# Patient Record
Sex: Female | Born: 1990 | Race: White | Marital: Single
Health system: Southern US, Community
[De-identification: ages and names within clinical notes are randomized; demographics above are authoritative.]

## PROBLEM LIST (undated history)

## (undated) DIAGNOSIS — E0500 Thyrotoxicosis with diffuse goiter without thyrotoxic crisis or storm: Secondary | ICD-10-CM

## (undated) HISTORY — PX: APPENDECTOMY: SHX54

## (undated) HISTORY — PX: EYE SURGERY: SHX253

---

## 2015-01-24 ENCOUNTER — Ambulatory Visit
Admission: RE | Admit: 2015-01-24 | Discharge: 2015-01-24 | Disposition: A | Discharge status: Home / Self Care | Payer: No Typology Code available for payment source | Source: Ambulatory Visit | Attending: Occupational Medicine | Admitting: Occupational Medicine

## 2015-01-24 ENCOUNTER — Other Ambulatory Visit: Payer: Self-pay | Admitting: Occupational Medicine

## 2015-01-24 DIAGNOSIS — Z021 Encounter for pre-employment examination: Secondary | ICD-10-CM

## 2015-01-30 ENCOUNTER — Other Ambulatory Visit: Payer: Self-pay | Admitting: Occupational Medicine

## 2015-11-28 ENCOUNTER — Other Ambulatory Visit: Payer: Self-pay | Admitting: Nurse Practitioner

## 2015-11-28 ENCOUNTER — Ambulatory Visit
Admission: RE | Admit: 2015-11-28 | Discharge: 2015-11-28 | Disposition: A | Discharge status: Home / Self Care | Payer: Worker's Compensation | Source: Ambulatory Visit | Attending: Nurse Practitioner | Admitting: Nurse Practitioner

## 2015-11-28 DIAGNOSIS — M25512 Pain in left shoulder: Secondary | ICD-10-CM

## 2018-01-28 IMAGING — CR DG SHOULDER 2+V*L*
3 series · 3 of 3 positions shown · non-contrast
Comparison: None.

CLINICAL DATA: Shoulder injury.  01/24/2015.

EXAM:
LEFT SHOULDER - 2+ VIEW

[w shoulder grashey left]
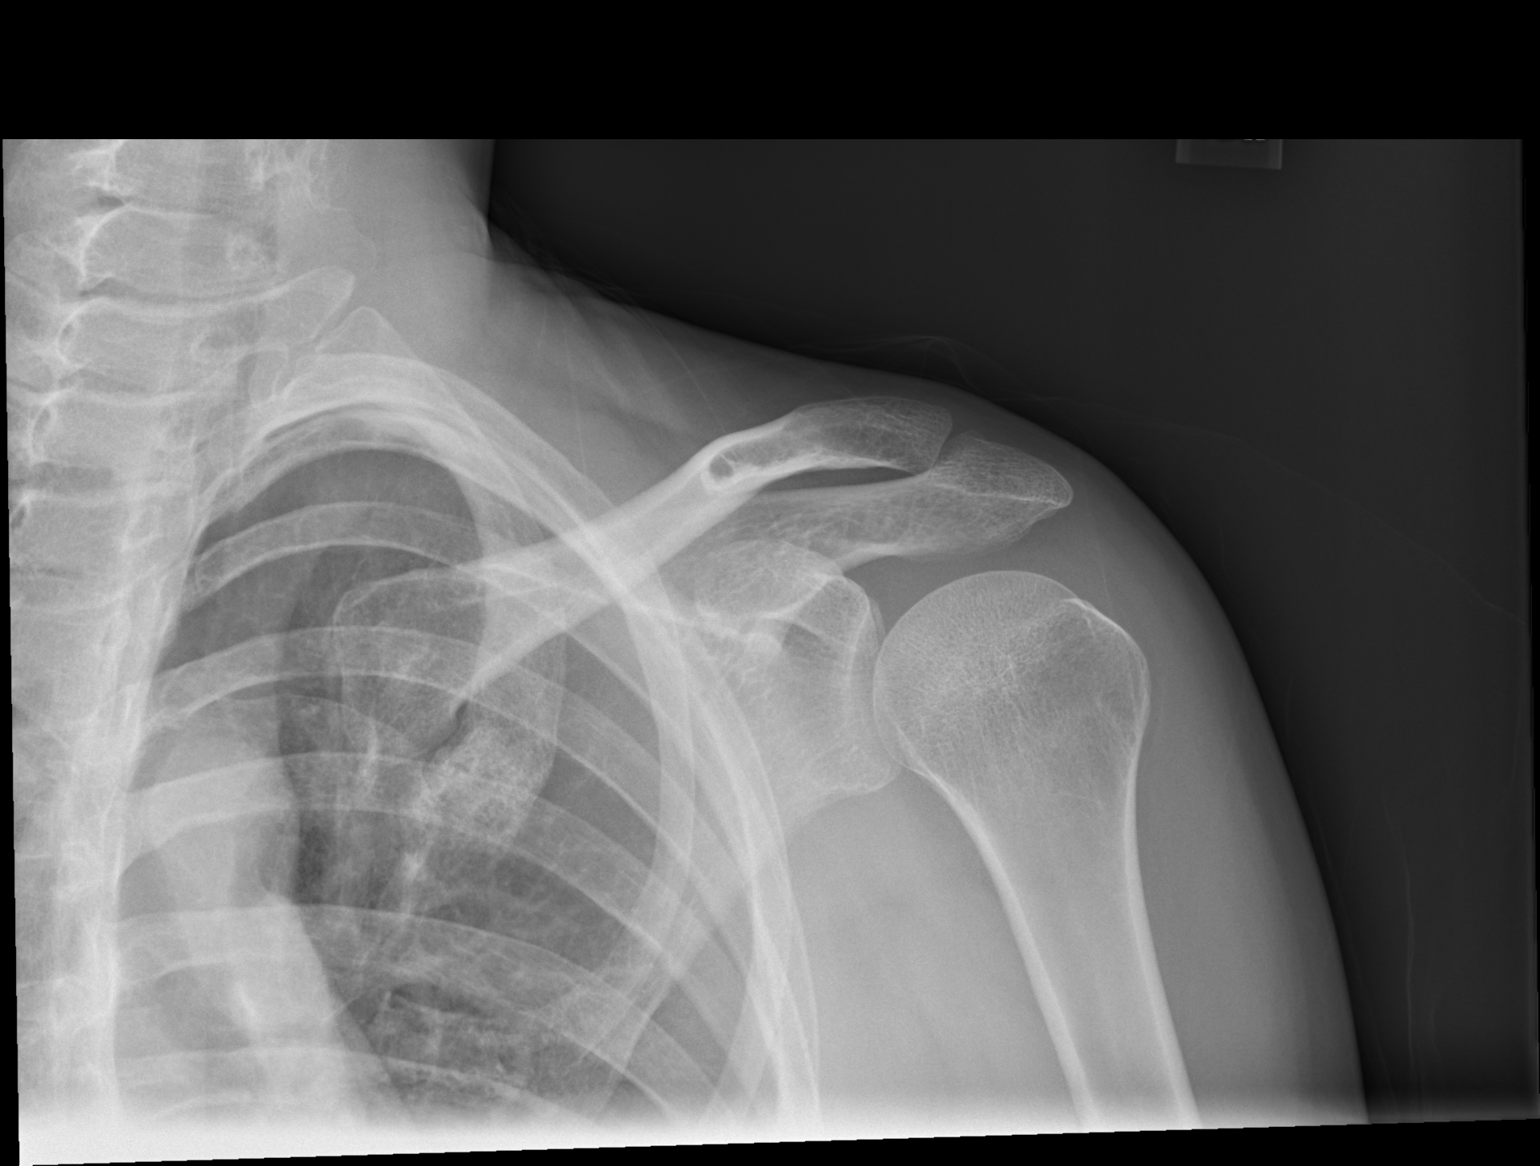

[w shoulder y-view left]
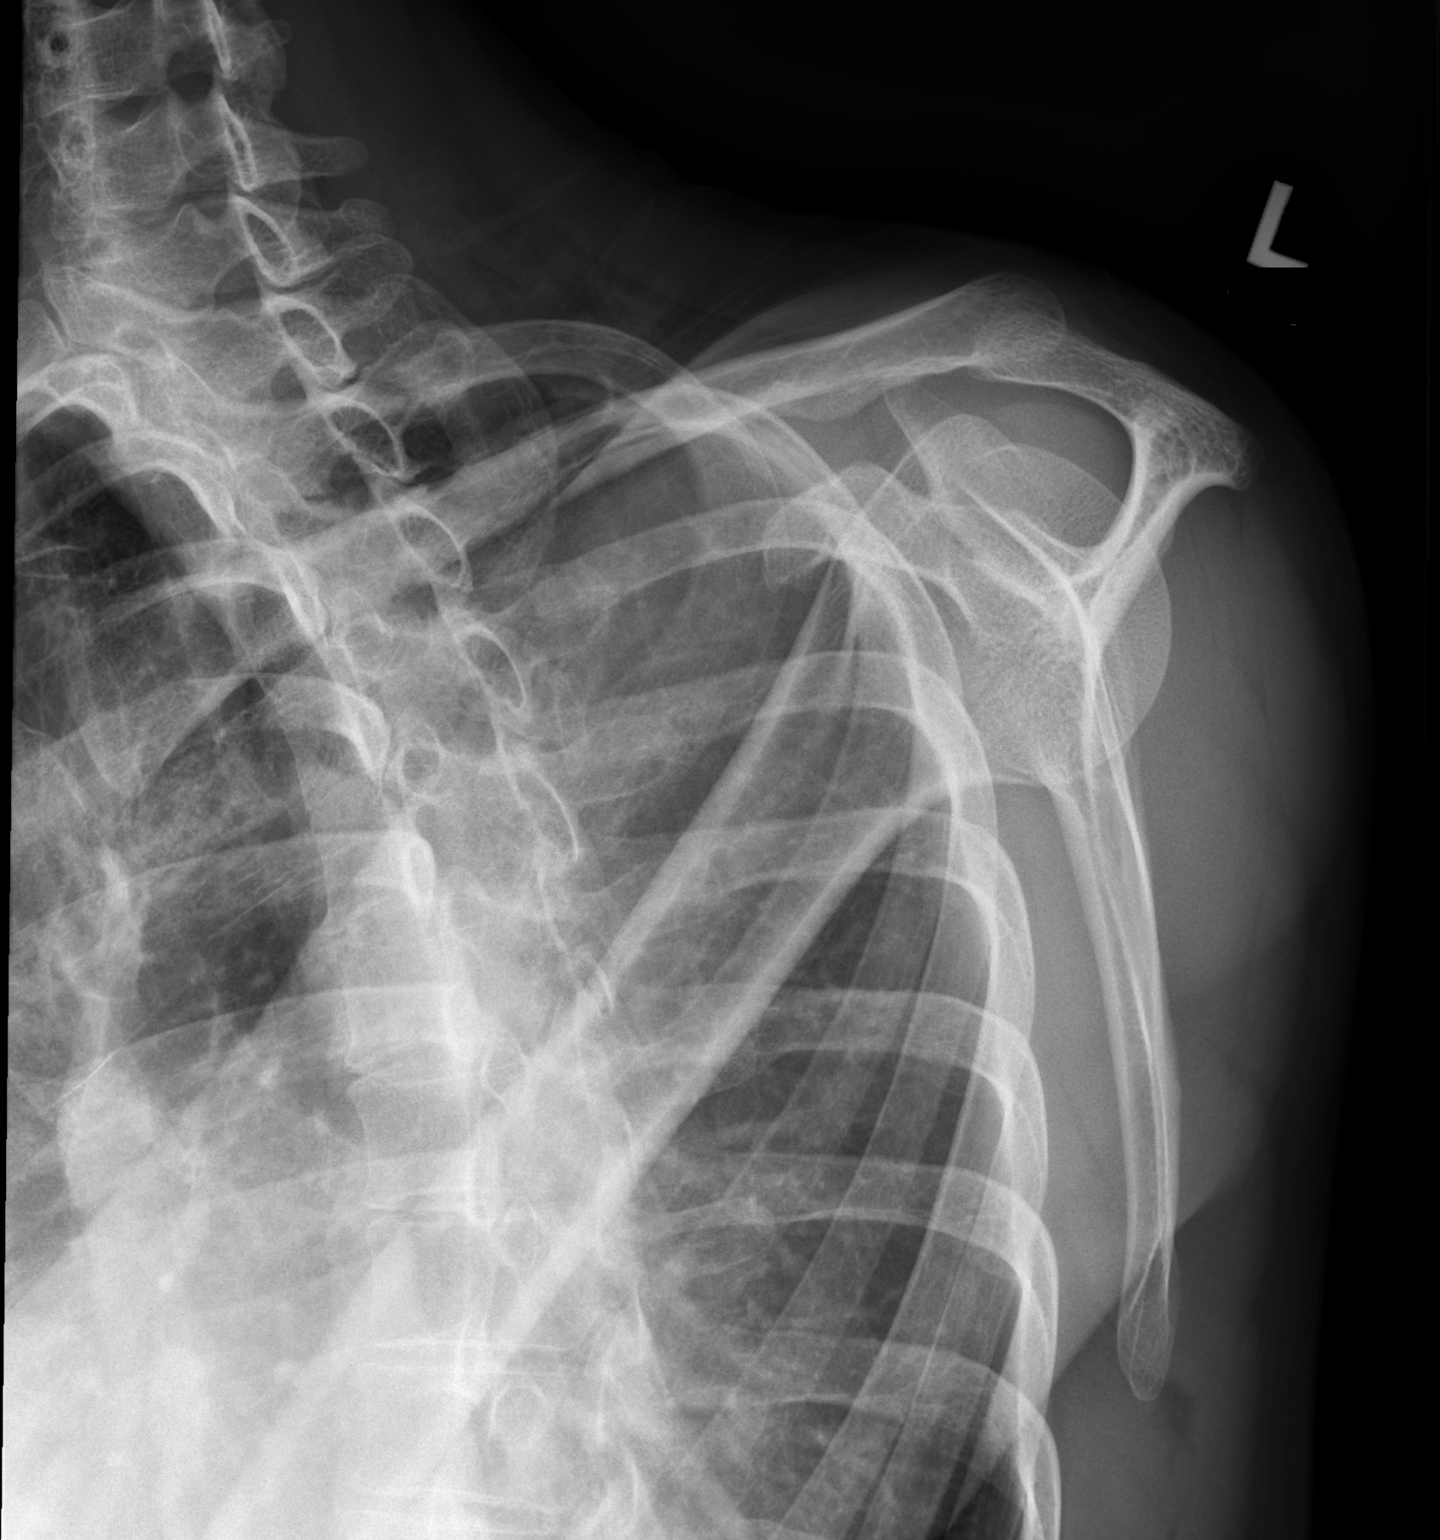

[w shoulder axillary left]
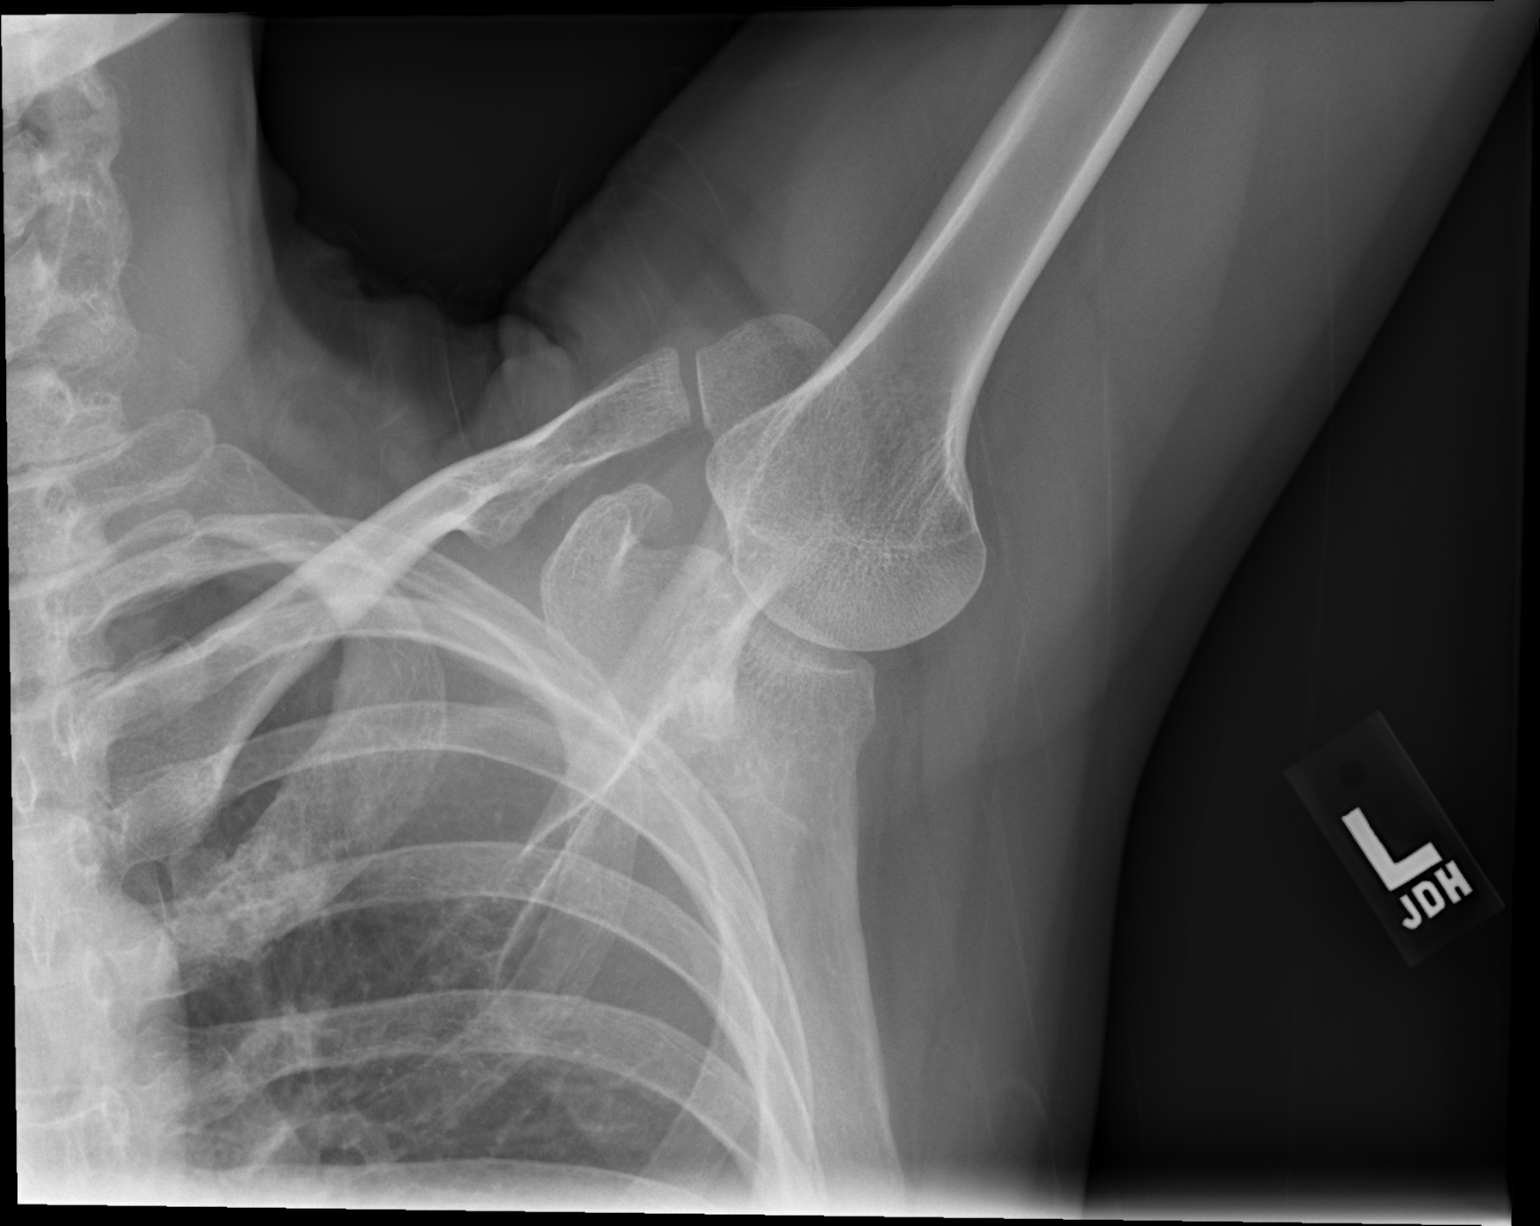

[3 of 3 positions shown; findings below may reference images not displayed]

FINDINGS: No acute bony or joint abnormality identified. No evidence of
fracture dislocation. No evidence separation.
IMPRESSION: No acute or focal abnormality.

## 2019-07-01 ENCOUNTER — Ambulatory Visit: Payer: Self-pay | Attending: Internal Medicine

## 2019-07-01 DIAGNOSIS — Z23 Encounter for immunization: Secondary | ICD-10-CM | POA: Insufficient documentation

## 2019-07-01 NOTE — Progress Notes (Signed)
   Covid-19 Vaccination Clinic  Name:  Phyllis Medina    MRN: [id number] DOB: 09-Jan-1991  07/01/2019  Phyllis Medina was observed post Covid-19 immunization for 15 minutes without incidence. She was provided with Vaccine Information Sheet and instruction to access the V-Safe system.   Phyllis Medina was instructed to call 911 with any severe reactions post vaccine: Marland Kitchen Difficulty breathing  . Swelling of your face and throat  . A fast heartbeat  . A bad rash all over your body  . Dizziness and weakness    Immunizations Administered    Name Date Dose VIS Date Route   Pfizer COVID-19 Vaccine 07/01/2019 10:42 AM 0.3 mL 04/22/2019 Intramuscular   Manufacturer: [employer], [employer]   Lot: [id number]   NDC: [address]-1

## 2019-07-26 ENCOUNTER — Ambulatory Visit: Payer: Self-pay | Attending: Internal Medicine

## 2019-07-26 DIAGNOSIS — Z23 Encounter for immunization: Secondary | ICD-10-CM

## 2019-07-26 NOTE — Progress Notes (Signed)
   Covid-19 Vaccination Clinic  Name:  Phyllis Medina    MRN: [id number] DOB: [birth year]  07/26/2019  [name] was observed post Covid-19 immunization for 15 minutes without incident. She was provided with Vaccine Information Sheet and instruction to access the V-Safe system.   [name] was instructed to call 911 with any severe reactions post vaccine: Marland Kitchen Difficulty breathing  . Swelling of face and throat  . A fast heartbeat  . A bad rash all over body  . Dizziness and weakness   Immunizations Administered    Name Date Dose VIS Date Route   Pfizer COVID-19 Vaccine 07/26/2019 10:06 AM 0.3 mL 04/22/2019 Intramuscular   Manufacturer: [employer], [employer]   Lot: [id number]   NDC: [address]-4

## 2020-12-31 ENCOUNTER — Other Ambulatory Visit: Payer: Self-pay

## 2020-12-31 ENCOUNTER — Ambulatory Visit
Admission: EM | Admit: 2020-12-31 | Discharge: 2020-12-31 | Disposition: A | Discharge status: Home / Self Care | Payer: 59 | Attending: Emergency Medicine | Admitting: Emergency Medicine

## 2020-12-31 ENCOUNTER — Ambulatory Visit (HOSPITAL_BASED_OUTPATIENT_CLINIC_OR_DEPARTMENT_OTHER)
Admission: RE | Admit: 2020-12-31 | Discharge: 2020-12-31 | Disposition: A | Discharge status: Home / Self Care | Payer: 59 | Source: Ambulatory Visit | Attending: Emergency Medicine | Admitting: Emergency Medicine

## 2020-12-31 DIAGNOSIS — R0781 Pleurodynia: Secondary | ICD-10-CM | POA: Diagnosis present

## 2020-12-31 DIAGNOSIS — R079 Chest pain, unspecified: Secondary | ICD-10-CM | POA: Diagnosis not present

## 2020-12-31 DIAGNOSIS — R918 Other nonspecific abnormal finding of lung field: Secondary | ICD-10-CM | POA: Insufficient documentation

## 2020-12-31 MED ORDER — PANTOPRAZOLE SODIUM 40 MG PO TBEC: 40.0000 mg | DELAYED_RELEASE_TABLET | Freq: Every day | ORAL | 0 refills | Status: AC

## 2020-12-31 MED ORDER — FAMOTIDINE 20 MG PO TABS: 20.0000 mg | ORAL_TABLET | Freq: Two times a day (BID) | ORAL | 0 refills | Status: AC

## 2020-12-31 MED ORDER — LIDOCAINE VISCOUS HCL 2 % MT SOLN
15.0000 mL | Freq: Once | OROMUCOSAL | Status: AC
  Administered 2020-12-31: 15 mL via ORAL

## 2020-12-31 MED ORDER — ALUM & MAG HYDROXIDE-SIMETH 200-200-20 MG/5ML PO SUSP
30.0000 mL | Freq: Once | ORAL | Status: AC
  Administered 2020-12-31: 30 mL via ORAL

## 2020-12-31 NOTE — Discharge Instructions (Addendum)
We gave you a GI cocktail-Maalox and lidocaine Continue with pantoprazole/Protonix daily x2 weeks for acid prevention Famotidine/Pepcid twice daily for more immediate relief of discomfort Please go for x-ray-I will call with results Continue to monitor discomfort, follow-up if not improving or worsening

## 2020-12-31 NOTE — ED Triage Notes (Signed)
Pt states having chest pain that started Saturday (3 days ago). Chest pain radiating from left chest to right shoulder and neck, now medial. Pain intensifies when deep breathing and yawning per patient. Pt states she has had eye surgery to right eye in the past. No numbness or tingling felt at this time. Sensation WNL Pain description: pt unable to determine

## 2020-12-31 NOTE — ED Provider Notes (Addendum)
[facility]    CSN: [id number] Arrival date & time: 12/31/20  1235      History   Chief Complaint Chief Complaint  Patient presents with   Chest Pain    HPI Phyllis Medina is a 30 y.o. female presenting today for evaluation of chest pain.  Reports that she has had a migratory chest discomfort over the past 3 days.  Of recently has been more prominent in her right lower ribs, slightly on left as well as her middle back.  Pain has been relatively constant.  Worse with deep inspiration and yawning.  Denies correlation between meals, certain times of the day or position.  Denies associated nausea or vomiting.  Does report the night prior to symptom onset reports drinking without any food on stomach.  HPI  History reviewed. No pertinent past medical history.  There are no problems to display for this patient.   History reviewed. No pertinent surgical history.  OB History   No obstetric history on file.      Home Medications    Prior to Admission medications   Medication Sig Start Date End Date Taking? Authorizing Provider  famotidine (PEPCID) 20 MG tablet Take 1 tablet (20 mg total) by mouth 2 (two) times daily. 12/31/20  Yes Riham Polyakov C, PA-C  pantoprazole (PROTONIX) 40 MG tablet Take 1 tablet (40 mg total) by mouth daily for 14 days. 12/31/20 01/14/21 Yes Nixon Kolton, Junius Creamer, PA-C    Family History History reviewed. No pertinent family history.  Social History     Allergies   Sulfa antibiotics and Estrogens   Review of Systems Review of Systems  Constitutional:  Negative for fatigue and fever.  HENT:  Negative for congestion, sinus pressure and sore throat.   Eyes:  Negative for photophobia, pain and visual disturbance.  Respiratory:  Negative for cough and shortness of breath.   Cardiovascular:  Positive for chest pain.  Gastrointestinal:  Negative for abdominal pain, nausea and vomiting.  Genitourinary:  Negative for decreased urine volume and  hematuria.  Musculoskeletal:  Negative for myalgias, neck pain and neck stiffness.  Neurological:  Positive for headaches. Negative for dizziness, syncope, facial asymmetry, speech difficulty, weakness, light-headedness and numbness.    Physical Exam Triage Vital Signs ED Triage Vitals [12/31/20 1315]  Enc Vitals Group     BP 114/78     Pulse Rate 62     Resp 18     Temp 98.5 F (36.9 C)     Temp Source Oral     SpO2 98 %     Weight      Height      Head Circumference      Peak Flow      Pain Score      Pain Loc      Pain Edu?      Excl. in GC?    No data found.  Updated Vital Signs BP 114/78 (BP Location: Right Arm)   Pulse 62   Temp 98.5 F (36.9 C) (Oral)   Resp 18   LMP 12/30/2020   SpO2 98%   Visual Acuity Right Eye Distance:   Left Eye Distance:   Bilateral Distance:    Right Eye Near:   Left Eye Near:    Bilateral Near:     Physical Exam Vitals and nursing note reviewed.  Constitutional:      Appearance: She is well-developed.     Comments: No acute distress  HENT:  Head: Normocephalic and atraumatic.     Nose: Nose normal.     Mouth/Throat:     Comments: Oral mucosa pink and moist, no tonsillar enlargement or exudate. Posterior pharynx patent and nonerythematous, no uvula deviation or swelling. Normal phonation.  Eyes:     Conjunctiva/sclera: Conjunctivae normal.  Cardiovascular:     Rate and Rhythm: Normal rate and regular rhythm.  Pulmonary:     Effort: Pulmonary effort is normal. No respiratory distress.     Comments: Breathing comfortably at rest, CTABL, no wheezing, rales or other adventitious sounds auscultated  No reproducible tenderness to palpation of chest   Abdominal:     General: There is no distension.     Comments: Soft, nondistended, mild tenderness to palpation in epigastrium and left upper quadrant, negative rebound  Musculoskeletal:        General: Normal range of motion.     Cervical back: Neck supple.  Skin:     General: Skin is warm and dry.  Neurological:     General: No focal deficit present.     Mental Status: She is alert and oriented to person, place, and time. Mental status is at baseline.     UC Treatments / Results  Labs (all labs ordered are listed, but only abnormal results are displayed) Labs Reviewed - No data to display  EKG   Radiology DG Chest 2 View  Result Date: 12/31/2020 CLINICAL DATA:  Pleuritic chest pain for 3 days EXAM: CHEST - 2 VIEW COMPARISON:  01/24/2015 FINDINGS: Normal heart size, mediastinal contours, and pulmonary vascularity. Lungs hyperinflated but clear. No pulmonary infiltrate, pleural effusion or pneumothorax. Osseous structures unremarkable. IMPRESSION: Mildly hyperinflated lungs without infiltrate. Electronically Signed   By: Ulyses Southward M.D.   On: 12/31/2020 15:18    Procedures Procedures (including critical care time)  Medications Ordered in UC Medications  alum & mag hydroxide-simeth (MAALOX/MYLANTA) 200-200-20 MG/5ML suspension 30 mL (30 mLs Oral Given 12/31/20 1404)    And  lidocaine (XYLOCAINE) 2 % viscous mouth solution 15 mL (15 mLs Oral Given 12/31/20 1404)    Initial Impression / Assessment and Plan / UC Course  I have reviewed the triage vital signs and the nursing notes.  Pertinent labs & imaging results that were available during my care of the patient were reviewed by me and considered in my medical decision making (see chart for details).     EKG sinus rhythm with sinus arrhythmia, no acute signs of ischemia or infarction, given associated abdominal discomfort did provide GI cocktail which provided some relief of symptoms, recommending continued treatment with PPI/antacid.  Chest x-ray pending, will call with results.  Low suspicion of ACS or PE at this time, but continue to monitor symptoms, discussed warning signs to follow-up in emergency room.  Discussed strict return precautions. Patient verbalized understanding and is agreeable  with plan.  Final Clinical Impressions(s) / UC Diagnoses   Final diagnoses:  Chest pain, unspecified type     Discharge Instructions      We gave you a GI cocktail-Maalox and lidocaine Continue with pantoprazole/Protonix daily x2 weeks for acid prevention Famotidine/Pepcid twice daily for more immediate relief of discomfort Please go for x-ray-I will call with results Continue to monitor discomfort, follow-up if not improving or worsening   ED Prescriptions     Medication Sig Dispense Auth. Provider   pantoprazole (PROTONIX) 40 MG tablet Take 1 tablet (40 mg total) by mouth daily for 14 days. 14 tablet Charvis Lightner, [place] C, PA-C  famotidine (PEPCID) 20 MG tablet Take 1 tablet (20 mg total) by mouth 2 (two) times daily. 30 tablet Lugene Hitt, [place] C, PA-C      PDMP not reviewed this encounter.   Lew Dawes, PA-C 12/31/20 [address], [place], [place] 12/31/20 1611

## 2023-03-03 IMAGING — DX DG CHEST 2V
2 series · 2 of 2 positions shown · non-contrast
Comparison: 01/24/2015

CLINICAL DATA: Pleuritic chest pain for 3 days

EXAM:
CHEST - 2 VIEW

[chest pa]
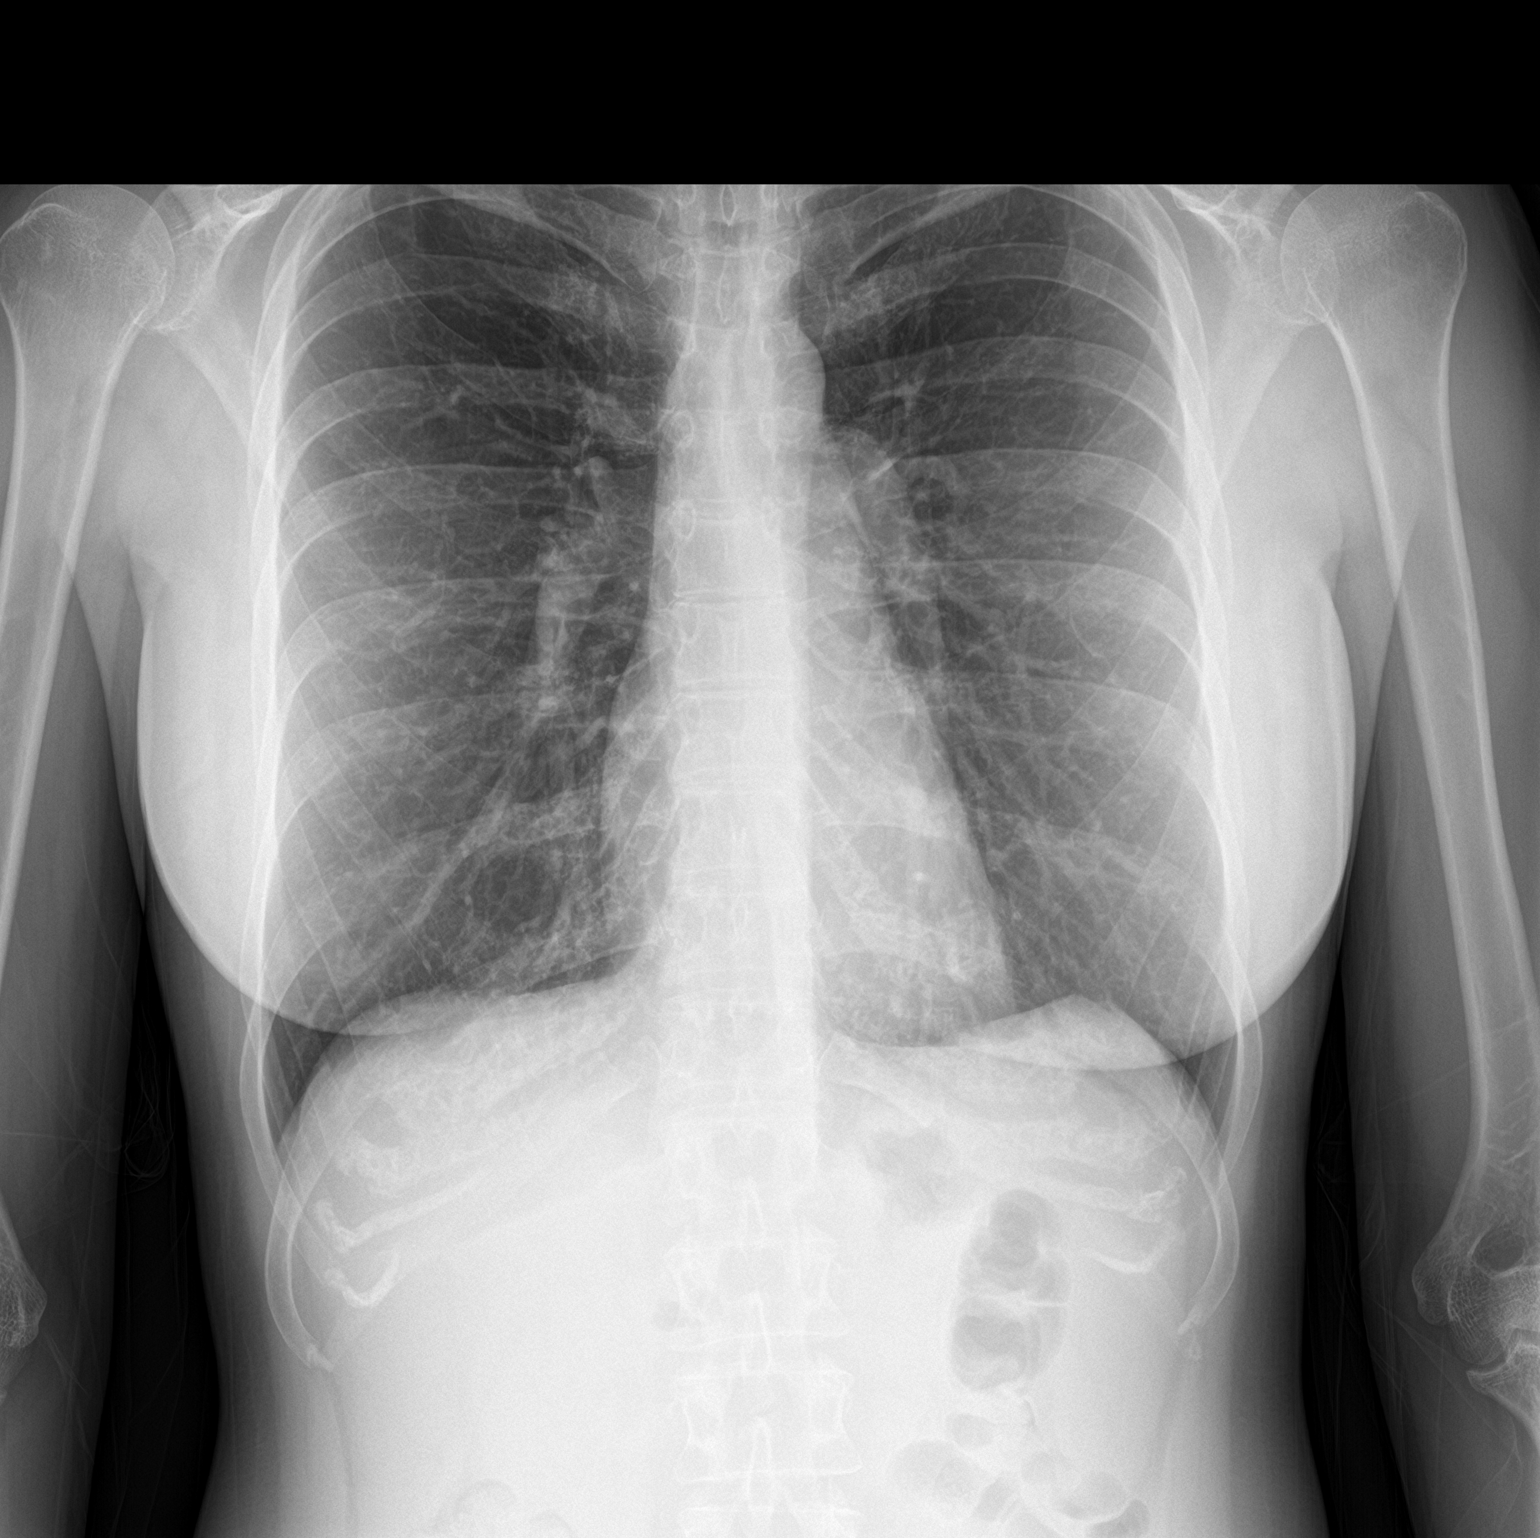

[chest lat]
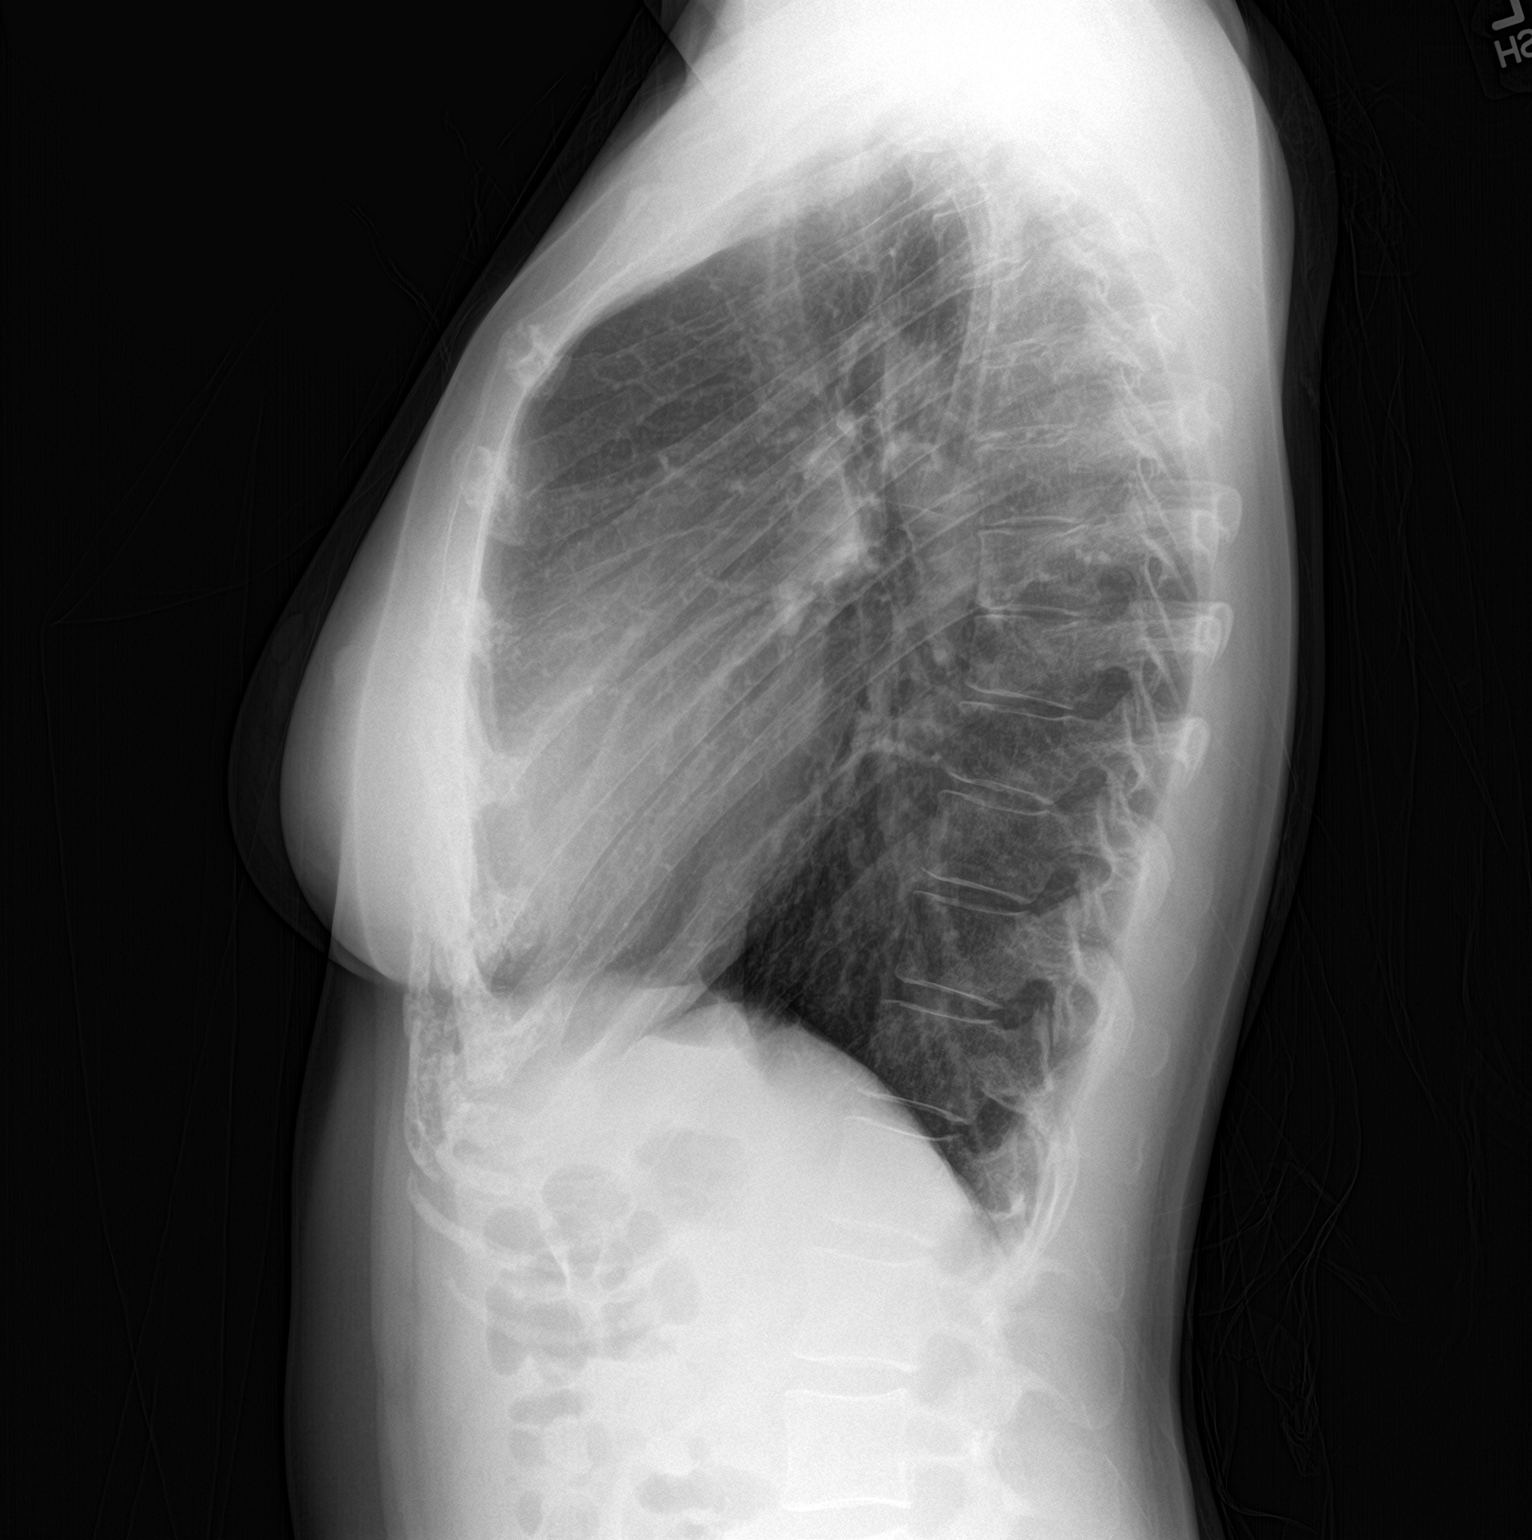

[2 of 2 positions shown; findings below may reference images not displayed]

FINDINGS: Normal heart size, mediastinal contours, and pulmonary vascularity.

Lungs hyperinflated but clear.

No pulmonary infiltrate, pleural effusion or pneumothorax.

Osseous structures unremarkable.
IMPRESSION: Mildly hyperinflated lungs without infiltrate.

## 2023-04-05 ENCOUNTER — Other Ambulatory Visit: Payer: Self-pay

## 2023-04-05 ENCOUNTER — Emergency Department (HOSPITAL_BASED_OUTPATIENT_CLINIC_OR_DEPARTMENT_OTHER): Payer: 59

## 2023-04-05 ENCOUNTER — Encounter (HOSPITAL_BASED_OUTPATIENT_CLINIC_OR_DEPARTMENT_OTHER): Payer: Self-pay | Admitting: Emergency Medicine

## 2023-04-05 ENCOUNTER — Emergency Department (HOSPITAL_BASED_OUTPATIENT_CLINIC_OR_DEPARTMENT_OTHER)
Admission: EM | Admit: 2023-04-05 | Discharge: 2023-04-05 | Disposition: A | Discharge status: Home / Self Care | Payer: 59 | Attending: Emergency Medicine | Admitting: Emergency Medicine

## 2023-04-05 ENCOUNTER — Ambulatory Visit: Payer: Self-pay

## 2023-04-05 DIAGNOSIS — R42 Dizziness and giddiness: Secondary | ICD-10-CM | POA: Diagnosis not present

## 2023-04-05 DIAGNOSIS — R1032 Left lower quadrant pain: Secondary | ICD-10-CM

## 2023-04-05 DIAGNOSIS — R1012 Left upper quadrant pain: Secondary | ICD-10-CM | POA: Diagnosis present

## 2023-04-05 DIAGNOSIS — R8271 Bacteriuria: Secondary | ICD-10-CM | POA: Insufficient documentation

## 2023-04-05 DIAGNOSIS — N938 Other specified abnormal uterine and vaginal bleeding: Secondary | ICD-10-CM | POA: Diagnosis not present

## 2023-04-05 HISTORY — DX: Thyrotoxicosis with diffuse goiter without thyrotoxic crisis or storm: E05.00

## 2023-04-05 LAB — URINALYSIS, MICROSCOPIC (REFLEX)

## 2023-04-05 LAB — CBC WITH DIFFERENTIAL/PLATELET
Abs Immature Granulocytes: 0.01 10*3/uL (ref 0.00–0.07)
Basophils Absolute: 0 10*3/uL (ref 0.0–0.1)
Basophils Relative: 0 %
Eosinophils Absolute: 0.1 10*3/uL (ref 0.0–0.5)
Eosinophils Relative: 3 %
HCT: 39.4 % (ref 36.0–46.0)
Hemoglobin: 13.3 g/dL (ref 12.0–15.0)
Immature Granulocytes: 0 %
Lymphocytes Relative: 28 %
Lymphs Abs: 1.3 10*3/uL (ref 0.7–4.0)
MCH: 30.3 pg (ref 26.0–34.0)
MCHC: 33.8 g/dL (ref 30.0–36.0)
MCV: 89.7 fL (ref 80.0–100.0)
Monocytes Absolute: 0.4 10*3/uL (ref 0.1–1.0)
Monocytes Relative: 8 %
Neutro Abs: 2.8 10*3/uL (ref 1.7–7.7)
Neutrophils Relative %: 61 %
Platelets: 223 10*3/uL (ref 150–400)
RBC: 4.39 MIL/uL (ref 3.87–5.11)
RDW: 12.3 % (ref 11.5–15.5)
WBC: 4.6 10*3/uL (ref 4.0–10.5)
nRBC: 0 % (ref 0.0–0.2)

## 2023-04-05 LAB — URINALYSIS, ROUTINE W REFLEX MICROSCOPIC
Bilirubin Urine: NEGATIVE
Glucose, UA: NEGATIVE mg/dL
Ketones, ur: NEGATIVE mg/dL
Leukocytes,Ua: NEGATIVE
Nitrite: NEGATIVE
Protein, ur: NEGATIVE mg/dL
Specific Gravity, Urine: 1.02 (ref 1.005–1.030)
pH: 5.5 (ref 5.0–8.0)

## 2023-04-05 LAB — COMPREHENSIVE METABOLIC PANEL
ALT: 12 U/L (ref 0–44)
AST: 20 U/L (ref 15–41)
Albumin: 4.3 g/dL (ref 3.5–5.0)
Alkaline Phosphatase: 45 U/L (ref 38–126)
Anion gap: 12 (ref 5–15)
BUN: 10 mg/dL (ref 6–20)
CO2: 24 mmol/L (ref 22–32)
Calcium: 9 mg/dL (ref 8.9–10.3)
Chloride: 103 mmol/L (ref 98–111)
Creatinine, Ser: 0.8 mg/dL (ref 0.44–1.00)
GFR, Estimated: >60 mL/min (ref >60)
Glucose, Bld: 94 mg/dL (ref 70–99)
Potassium: 3.9 mmol/L (ref 3.5–5.1)
Sodium: 139 mmol/L (ref 135–145)
Total Bilirubin: 0.7 mg/dL (ref <1.2)
Total Protein: 7.5 g/dL (ref 6.5–8.1)

## 2023-04-05 LAB — PREGNANCY, URINE: Preg Test, Ur: NEGATIVE

## 2023-04-05 MED ORDER — IOHEXOL 300 MG/ML  SOLN
75.0000 mL | Freq: Once | INTRAMUSCULAR | Status: AC | PRN
  Administered 2023-04-05: 75 mL via INTRAVENOUS

## 2023-04-05 NOTE — ED Provider Notes (Signed)
[facility] Provider Note   CSN: [id number] Arrival date & time: 04/05/23  1431    History  Chief Complaint  Patient presents with   Abdominal Pain    Karalyne Thiede is a 32 y.o. female here for evaluation of left-sided abdominal pain.  States her 60 pound dog jumped on her left abdomen.  Stated immediately after this occurred she started having vaginal bleeding her menstrual cycle was 11/7.  She still has diffuse pain to her left upper and lower, no chest pain, shortness of breath.  No head injury.  She has had some lightheadedness.  No dysuria or hematuria, flank pain. Denies chance of pregnancy.  HPI     Home Medications Prior to Admission medications   Medication Sig Start Date End Date Taking? Authorizing Provider  famotidine (PEPCID) 20 MG tablet Take 1 tablet (20 mg total) by mouth 2 (two) times daily. 12/31/20   Wieters, Hallie C, PA-C  pantoprazole (PROTONIX) 40 MG tablet Take 1 tablet (40 mg total) by mouth daily for 14 days. 12/31/20 01/14/21  Wieters, Hallie C, PA-C      Allergies    Sulfa antibiotics and Estrogens    Review of Systems   Review of Systems  Constitutional: Negative.   HENT: Negative.    Respiratory: Negative.    Cardiovascular: Negative.   Gastrointestinal:  Positive for abdominal pain.  Genitourinary:  Positive for vaginal pain.  Musculoskeletal: Negative.   Skin: Negative.   Neurological: Negative.   All other systems reviewed and are negative.   Physical Exam Updated Vital Signs BP 103/84   Pulse 61   Temp 98.4 F (36.9 C) (Oral)   Resp 18   Ht 5\' 3"  (1.6 m)   Wt 57.6 kg   LMP 03/19/2023   SpO2 99%   BMI 22.50 kg/m  Physical Exam Vitals and nursing note reviewed.  Constitutional:      General: She is not in acute distress.    Appearance: She is well-developed. She is not ill-appearing, toxic-appearing or diaphoretic.  HENT:     Head: Atraumatic.  Eyes:     Pupils: Pupils are  equal, round, and reactive to light.  Cardiovascular:     Rate and Rhythm: Normal rate.  Pulmonary:     Effort: No respiratory distress.  Abdominal:     General: Bowel sounds are normal. There is no distension.     Tenderness: There is abdominal tenderness in the suprapubic area, left upper quadrant and left lower quadrant. There is no right CVA tenderness, left CVA tenderness, guarding or rebound. Negative signs include Murphy's sign and McBurney's sign.     Comments: Soft, diffuse tenderness to left upper and lower abdomen.  No overlying skin changes.  Musculoskeletal:        General: Normal range of motion.     Cervical back: Normal range of motion.  Skin:    General: Skin is warm and dry.  Neurological:     General: No focal deficit present.     Mental Status: She is alert.  Psychiatric:        Mood and Affect: Mood normal.     ED Results / Procedures / Treatments   Labs (all labs ordered are listed, but only abnormal results are displayed) Labs Reviewed  URINALYSIS, ROUTINE W REFLEX MICROSCOPIC - Abnormal; Notable for the following components:      Result Value   Hgb urine dipstick LARGE (*)    All other  components within normal limits  URINALYSIS, MICROSCOPIC (REFLEX) - Abnormal; Notable for the following components:   Bacteria, UA MANY (*)    All other components within normal limits  PREGNANCY, URINE  COMPREHENSIVE METABOLIC PANEL  CBC WITH DIFFERENTIAL/PLATELET    EKG None  Radiology US PELVIC COMPLETE W TRANSVAGINAL AND TORSION R/O  Result Date: 04/05/2023 CLINICAL DATA:  Left lower quadrant pain EXAM: TRANSABDOMINAL AND TRANSVAGINAL ULTRASOUND OF PELVIS DOPPLER ULTRASOUND OF OVARIES TECHNIQUE: Both transabdominal and transvaginal ultrasound examinations of the pelvis were performed. Transabdominal technique was performed for global imaging of the pelvis including uterus, ovaries, adnexal regions, and pelvic cul-de-sac. It was necessary to proceed with  endovaginal exam following the transabdominal exam to visualize the uterus endometrium ovaries. Color and duplex Doppler ultrasound was utilized to evaluate blood flow to the ovaries. COMPARISON:  None Available. FINDINGS: Uterus Measurements: 7 x 3 x 4.6 cm = volume: 51 mL. No fibroids or other mass visualized. Endometrium Thickness: 3.7 mm.  No focal abnormality visualized. Right ovary Measurements: 3.6 x 2.2 x 2.2 cm = volume: 9 mL. Normal appearance/no adnexal mass. Left ovary Measurements: 4.7 x 2.2 x 1.7 cm = volume: 9.4 mL. Normal appearance/no adnexal mass. Pulsed Doppler evaluation of both ovaries demonstrates normal low-resistance arterial and venous waveforms. Other findings No abnormal free fluid. IMPRESSION: Negative pelvic ultrasound. Electronically Signed   By: Jasmine Pang M.D.   On: 04/05/2023 20:28   CT ABDOMEN PELVIS W CONTRAST  Result Date: 04/05/2023 CLINICAL DATA:  Abdominal trauma.  Dog jumped on abdomen. EXAM: CT ABDOMEN AND PELVIS WITH CONTRAST TECHNIQUE: Multidetector CT imaging of the abdomen and pelvis was performed using the standard protocol following bolus administration of intravenous contrast. RADIATION DOSE REDUCTION: This exam was performed according to the departmental dose-optimization program which includes automated exposure control, adjustment of the mA and/or kV according to patient size and/or use of iterative reconstruction technique. CONTRAST:  75mL OMNIPAQUE IOHEXOL 300 MG/ML  SOLN COMPARISON:  None Available. FINDINGS: Lower chest: The lung bases are clear of acute process. No pleural effusion or pulmonary lesions. The heart is normal in size. No pericardial effusion. The distal esophagus and aorta are unremarkable. Hepatobiliary: No focal liver abnormality is seen. No gallstones, gallbladder wall thickening, or biliary dilatation. Pancreas: Unremarkable. No pancreatic ductal dilatation or surrounding inflammatory changes. Spleen: No splenic injury or perisplenic  hematoma. Adrenals/Urinary Tract: Adrenal glands are unremarkable. Kidneys are normal, without renal calculi, focal lesion, or hydronephrosis. Bladder is unremarkable. Stomach/Bowel: The stomach, duodenum, small bowel and colon are grossly normal without oral contrast. No inflammatory changes, mass lesions or obstructive findings. The appendix is surgically absent. Vascular/Lymphatic: The aorta is normal in caliber. No dissection. The branch vessels are patent. The major venous structures are patent. No mesenteric or retroperitoneal mass or adenopathy. Small scattered lymph nodes are noted. Reproductive: The uterus and ovaries are normal. Other: No pelvic mass or adenopathy. No free pelvic fluid collections. No inguinal mass or adenopathy. Small periumbilical abdominal hernia containing fat. No subcutaneous lesions or hematoma. Musculoskeletal: No significant bony findings. IMPRESSION: 1. No acute abdominal/pelvic findings, mass lesions or adenopathy. 2. Intact bony structures. Electronically Signed   By: Rudie Meyer M.D.   On: 04/05/2023 18:02    Procedures Procedures    Medications Ordered in ED Medications  iohexol (OMNIPAQUE) 300 MG/ML solution 75 mL (75 mLs Intravenous Contrast Given 04/05/23 1704)   ED Course/ Medical Decision Making/ A&P Clinical Course as of 04/05/23 2115  [facility] Apr 05, 2023  2113 Urinalysis, Microscopic (reflex)(!) No UTI symptoms [BH]    Clinical Course User Index [BH] Kemya Shed A, PA-C   32 year old here for evaluation of left side abdominal pain after 60 pound dog bounced on her abdomen.  She has pain to her left upper and lower abdomen.  Right after incident she developed some vaginal bleeding, her last menstrual cycle was 03/23/2023.  Some lightheadedness.  She denies any head injury.  Denies chance of pregnancy.  Plan on labs, imaging and reassess.  She denies need for pain medicine at this time.  Labs and imaging personally viewed and interpreted:  CBC  without leukocytosis, hemoglobin 13.3 Metabolic panel without significant abnormality UA many bacteria, nitrite, leuk negative Pregnancy test negative CT abdomen pelvis without significant abnormality  Patient reassessed.  We discussed CT scan.  Given her vaginal bleeding will get ultrasound  Ultrasound negative for acute pathology  Patient reassessed.  We discussed her labs and imaging.  She does have some bacteria in her urine however no UTI symptoms.  Nitrite, leuks negative, shared dec making, we will hold on antibiotics at this time, her presentation today does not seem suggestive of pyelonephritis, UTI, stone.  Patient is nontoxic, nonseptic appearing, in no apparent distress.  Patient's pain and other symptoms adequately managed in emergency department.  Fluid bolus given.  Labs, imaging and vitals reviewed.  Patient does not meet the SIRS or Sepsis criteria.  On repeat exam patient does not have a surgical abdomin and there are no peritoneal signs.  No indication of appendicitis, bowel obstruction, bowel perforation, cholecystitis, diverticulitis, PID, intermittent/persistent torsion or ectopic pregnancy.  Patient discharged home with symptomatic treatment and given strict instructions for follow-up with their primary care physician.  I have also discussed reasons to return immediately to the ER.  Patient expresses understanding and agrees with plan.                                  Medical Decision Making Amount and/or Complexity of Data Reviewed Independent Historian: friend External Data Reviewed: labs, radiology and notes. Labs: ordered. Decision-making details documented in ED Course. Radiology: ordered and independent interpretation performed. Decision-making details documented in ED Course.  Risk OTC drugs. Prescription drug management. Decision regarding hospitalization. Diagnosis or treatment significantly limited by social determinants of health.          Final  Clinical Impression(s) / ED Diagnoses Final diagnoses:  Left lower quadrant abdominal pain  DUB (dysfunctional uterine bleeding)  Bacteria in urine    Rx / DC Orders ED Discharge Orders     None         Janan Bogie A, PA-C 04/05/23 2116    Lonell Grandchild, MD 04/06/23 1511

## 2023-04-05 NOTE — ED Notes (Signed)
Patient transported to Ultrasound 

## 2023-04-05 NOTE — ED Triage Notes (Signed)
Pt sts her 60 lb dog jumped on her while she was on the sofa 36 hrs ago, landing on LLQ; she c/o abd pain that is spreading and vaginal bleeding (normal cycle started 11/7)

## 2023-04-05 NOTE — Discharge Instructions (Addendum)
It was a pleasure taking care of you here in the emergency department I would recommend following up outpatient, return for any worsening symptoms.  Tylenol and Motrin as needed for pain  Follow-up with OB/GYN if your vaginal bleeding does not resolve
